# Patient Record
Sex: Female | Born: 1980 | Hispanic: Yes | Marital: Single | Smoking: Never smoker
Health system: Southern US, Community
[De-identification: ages and names within clinical notes are randomized; demographics above are authoritative.]

## PROBLEM LIST (undated history)

## (undated) DIAGNOSIS — M069 Rheumatoid arthritis, unspecified: Secondary | ICD-10-CM

## (undated) HISTORY — PX: COLPOSCOPY: SHX161

## (undated) HISTORY — DX: Rheumatoid arthritis, unspecified: M06.9

---

## 2013-11-24 ENCOUNTER — Encounter: Payer: Self-pay | Admitting: Gynecology

## 2013-12-28 ENCOUNTER — Encounter: Payer: Self-pay | Admitting: Gynecology

## 2014-01-22 ENCOUNTER — Telehealth: Payer: Self-pay | Admitting: Gynecology

## 2014-01-22 NOTE — Telephone Encounter (Signed)
Left patient a message we need to reschedule her cancelled appointment for Monday, 01/25/14, due to doctor will be in surgery. Please offer patient 12:00 PM 01/26/14 with Dr. Farrel Gobble per Kennon Rounds.

## 2014-01-25 ENCOUNTER — Encounter: Payer: Self-pay | Admitting: Gynecology

## 2014-03-10 ENCOUNTER — Encounter: Payer: Self-pay | Admitting: Obstetrics and Gynecology

## 2014-03-10 ENCOUNTER — Ambulatory Visit (INDEPENDENT_AMBULATORY_CARE_PROVIDER_SITE_OTHER): Payer: Self-pay | Admitting: Obstetrics and Gynecology

## 2014-03-10 VITALS — BP 120/78 | HR 80 | Resp 20 | Ht 65.5 in | Wt 151.4 lb

## 2014-03-10 DIAGNOSIS — Z87440 Personal history of urinary (tract) infections: Secondary | ICD-10-CM

## 2014-03-10 DIAGNOSIS — Z308 Encounter for other contraceptive management: Secondary | ICD-10-CM

## 2014-03-10 DIAGNOSIS — L989 Disorder of the skin and subcutaneous tissue, unspecified: Secondary | ICD-10-CM

## 2014-03-10 DIAGNOSIS — Z0000 Encounter for general adult medical examination without abnormal findings: Secondary | ICD-10-CM

## 2014-03-10 LAB — POCT URINALYSIS DIPSTICK
Bilirubin, UA: NEGATIVE
Blood, UA: NEGATIVE
Glucose, UA: NEGATIVE
Ketones, UA: NEGATIVE
Leukocytes, UA: NEGATIVE
NITRITE UA: NEGATIVE
PH UA: 5
Protein, UA: NEGATIVE
Urobilinogen, UA: NEGATIVE

## 2014-03-10 NOTE — Patient Instructions (Signed)
Drospirenone; Ethinyl Estradiol tablets What is this medicine? DROSPIRENONE; ETHINYL ESTRADIOL (dro SPY re nown; ETH in il es tra DYE ole) is an oral contraceptive (birth control pill). This medicine combines two types of female hormones, an estrogen and a progestin. It is used to prevent ovulation and pregnancy. This medicine may be used for other purposes; ask your health care provider or pharmacist if you have questions. COMMON BRAND NAME(S): Gianvi, Loryna, Nikki 28-Day, Ocella, Syeda, Vestura, Yasmin, Yaz, Zarah What should I tell my health care provider before I take this medicine? They need to know if you have or ever had any of these conditions: -abnormal vaginal bleeding -adrenal gland disease -blood vessel disease or blood clots -breast, cervical, endometrial, ovarian, liver, or uterine cancer -diabetes -gallbladder disease -heart disease or recent heart attack -high blood pressure -high cholesterol -high potassium level -kidney disease -liver disease -migraine headaches -stroke -systemic lupus erythematosus (SLE) -tobacco smoker -an unusual or allergic reaction to estrogens, progestins, or other medicines, foods, dyes, or preservatives -pregnant or trying to get pregnant -breast-feeding How should I use this medicine? Take this medicine by mouth. To reduce nausea, this medicine may be taken with food. Follow the directions on the prescription label. Take this medicine at the same time each day and in the order directed on the package. Do not take your medicine more often than directed. A patient package insert for the product will be given with each prescription and refill. Read this sheet carefully each time. The sheet may change frequently. Talk to your pediatrician regarding the use of this medicine in children. Special care may be needed. This medicine has been used in female children who have started having menstrual periods. Overdosage: If you think you have taken too  much of this medicine contact a poison control center or emergency room at once. NOTE: This medicine is only for you. Do not share this medicine with others. What if I miss a dose? If you miss a dose, refer to the patient information sheet you received with your medicine for direction. If you miss more than one pill, this medicine may not be as effective and you may need to use another form of birth control. What may interact with this medicine? -acetaminophen -antibiotics or medicines for infections, especially rifampin, rifabutin, rifapentine, and griseofulvin, and possibly penicillins or tetracyclines -aprepitant -ascorbic acid (vitamin C) -atorvastatin -barbiturate medicines, such as phenobarbital -bosentan -carbamazepine -caffeine -clofibrate -cyclosporine -dantrolene -doxercalciferol -felbamate -grapefruit juice -hydrocortisone -medicines for anxiety or sleeping problems, such as diazepam or temazepam -medicines for diabetes, including pioglitazone -mineral oil -modafinil -mycophenolate -nefazodone -oxcarbazepine -phenytoin -prednisolone -ritonavir or other medicines for HIV infection or AIDS -rosuvastatin -selegiline -soy isoflavones supplements -St. John's wort -tamoxifen or raloxifene -theophylline -thyroid hormones -topiramate -warfarin This product is different from other birth control pills because it contains the progestin drospirenone. Drospirenone may increase potassium levels. Interactions with other drugs may increase the chance of an elevated potassium level. You may need blood tests to check your potassium level. Drugs that can increase the potassium level include: -certain medications for high blood pressure or heart conditions (examples include ACE-inhibitors and also Angiotensin-II receptor blockers, and Eplerenone -dietary salt substitutes (these may contain potassium) -heparin -NSAIDs (antiinflammatory drugs), if they are taken long-term and daily,  like for arthritis -potassium supplements -some 'water pills' (diuretics like amiloride, spironolactone or triamterene) This list may not describe all possible interactions. Give your health care provider a list of all the medicines, herbs, non-prescription drugs, or dietary supplements   you use. Also tell them if you smoke, drink alcohol, or use illegal drugs. Some items may interact with your medicine. What should I watch for while using this medicine? Visit your doctor or health care professional for regular checks on your progress. You will need a regular breast and pelvic exam and Pap smear while on this medicine. Use an additional method of contraception during the first cycle that you take these tablets. If you have any reason to think you are pregnant, stop taking this medicine right away and contact your doctor or health care professional. If you are taking this medicine for hormone related problems, it may take several cycles of use to see improvement in your condition. Smoking increases the risk of getting a blood clot or having a stroke while you are taking birth control pills, especially if you are more than 32 years old. You are strongly advised not to smoke. This medicine can make your body retain fluid, making your fingers, hands, or ankles swell. Your blood pressure can go up. Contact your doctor or health care professional if you feel you are retaining fluid. This medicine can make you more sensitive to the sun. Keep out of the sun. If you cannot avoid being in the sun, wear protective clothing and use sunscreen. Do not use sun lamps or tanning beds/booths. If you wear contact lenses and notice visual changes, or if the lenses begin to feel uncomfortable, consult your eye care specialist. In some women, tenderness, swelling, or minor bleeding of the gums may occur. Notify your dentist if this happens. Brushing and flossing your teeth regularly may help limit this. See your dentist  regularly and inform your dentist of the medicines you are taking. If you are going to have elective surgery, you may need to stop taking this medicine before the surgery. Consult your health care professional for advice. This medicine does not protect you against HIV infection (AIDS) or any other sexually transmitted diseases. What side effects may I notice from receiving this medicine? Side effects that you should report to your doctor or health care professional as soon as possible: -allergic reactions like skin rash, itching or hives, swelling of the face, lips, or tongue -breast tissue changes or discharge -changes in vision -chest pain -confusion, trouble speaking or understanding -dark urine -general ill feeling or flu-like symptoms -light-colored stools -nausea, vomiting -pain, swelling, warmth in the leg -right upper belly pain -severe headaches -shortness of breath -sudden numbness or weakness of the face, arm or leg -trouble walking, dizziness, loss of balance or coordination -unusual vaginal bleeding -yellowing of the eyes or skin Side effects that usually do not require medical attention (report to your doctor or health care professional if they continue or are bothersome): -acne -brown spots on the face -change in appetite -change in sexual desire -depressed mood or mood swings -fluid retention and swelling -stomach cramps or bloating -unusually weak or tired -weight gain This list may not describe all possible side effects. Call your doctor for medical advice about side effects. You may report side effects to FDA at 1-800-FDA-1088. Where should I keep my medicine? Keep out of the reach of children. Store at room temperature between 15 and 30 degrees C (59 and 86 degrees F). Throw away any unused medicine after the expiration date. NOTE: This sheet is a summary. It may not cover all possible information. If you have questions about this medicine, talk to your doctor,  pharmacist, or health care provider.  2015, Elsevier/Gold   Standard. (2008-04-01 13:02:54)  Intrauterine Device Information An intrauterine device (IUD) is inserted into your uterus to prevent pregnancy. There are two types of IUDs available:   Copper IUD--This type of IUD is wrapped in copper wire and is placed inside the uterus. Copper makes the uterus and fallopian tubes produce a fluid that kills sperm. The copper IUD can stay in place for 10 years.  Hormone IUD--This type of IUD contains the hormone progestin (synthetic progesterone). The hormone thickens the cervical mucus and prevents sperm from entering the uterus. It also thins the uterine lining to prevent implantation of a fertilized egg. The hormone can weaken or kill the sperm that get into the uterus. One type of hormone IUD can stay in place for 5 years, and another type can stay in place for 3 years. Your health care provider will make sure you are a good candidate for a contraceptive IUD. Discuss with your health care provider the possible side effects.  ADVANTAGES OF AN INTRAUTERINE DEVICE  IUDs are highly effective, reversible, long acting, and low maintenance.   There are no estrogen-related side effects.   An IUD can be used when breastfeeding.   IUDs are not associated with weight gain.   The copper IUD works immediately after insertion.   The hormone IUD works right away if inserted within 7 days of your period starting. You will need to use a backup method of birth control for 7 days if the hormone IUD is inserted at any other time in your cycle.  The copper IUD does not interfere with your female hormones.   The hormone IUD can make heavy menstrual periods lighter and decrease cramping.   The hormone IUD can be used for 3 or 5 years.   The copper IUD can be used for 10 years. DISADVANTAGES OF AN INTRAUTERINE DEVICE  The hormone IUD can be associated with irregular bleeding patterns.   The copper  IUD can make your menstrual flow heavier and more painful.   You may experience cramping and vaginal bleeding after insertion.  Document Released: 03/20/2004 Document Revised: 12/17/2012 Document Reviewed: 10/05/2012 [facility] Patient Information 2015 [employer] [place]. This information is not intended to replace advice given to you by your health care provider. Make sure you discuss any questions you have with your health care provider.

## 2014-03-10 NOTE — Progress Notes (Signed)
Patient ID: Vicki OlszewskiSabrina Tyler, female   DOB: 02/15/1981, 33 y.o.   MRN: [id number] GYNECOLOGY VISIT  PCP:  None  Referring provider:   HPI: 33 y.o.   Single  Caucasian  female   G0P0000 with Patient's last menstrual period was 02/28/2014 (exact date).   here establish care and to be evaluated for possible urinary tract infection.  Had annual exam in December in 2014 in [place].  Normal breast ultrasound and pelvic ultrasound which was done routinely.  Did serum STD testing which were negative.  Cholesterol and general labs normal.  No history of recurrent UTIs.   Had dysuria following intercourse.  Had urine culture in [place] but did not treat prior to returning to the [place]. Today not symptoms.  Used a condom and had it fall off.  Thinks this caused the irritation.   Had rheumatoid arthritis.   Takes birth control for secondary benefits of acne control. Has varicose veins.  Stopped oral contraceptives due to concern about potential risk of DVT.  Asking about birth control option.   Concerned about an area on her buttock.   [place]  From [place], [place].   Urine:  Neg  GYNECOLOGIC HISTORY: Patient's last menstrual period was 02/28/2014 (exact date). Sexually active:  Not currently Partner preference: female Contraception:  condoms Menopausal hormone therapy: n/a DES exposure:  no Blood transfusions:   no Sexually transmitted diseases: no GYN procedures and prior surgeries:  no Last mammogram:   Ultrasound 03/2013 wnl:in [place]              Last pap and high risk HPV testing:  03/2013 WUJ:[id number]:unsure of HPV testing:in [place]  History of abnormal pap smear: no    OB History    Gravida Para Term Preterm AB TAB SAB Ectopic Multiple Living   0 0 0 0 0 0 0 0 0 0        LIFESTYLE: Exercise: weights/dance/yoga              Tobacco: no Alcohol:   no Drug use:  no  There are no active problems to display for this patient.   Past Medical History   Diagnosis Date  . Rheumatoid arthritis     History reviewed. No pertinent past surgical history.  Current Outpatient Prescriptions  Medication Sig Dispense Refill  . chloroquine (ARALEN) 250 MG tablet Take 250 mg by mouth daily.    . Folic Acid 5 MG CAPS Take 5 mg by mouth. Take Tues and Friday    . methotrexate (RHEUMATREX) 2.5 MG tablet Take 2.5 mg by mouth. Caution:Chemotherapy. Protect from light.PATIENT TAKES WED. AND THURS.     No current facility-administered medications for this visit.     ALLERGIES: Review of patient's allergies indicates no known allergies.  Family History  Problem Relation Age of Onset  . Hypertension Father   . Hyperlipidemia Father   . Diabetes Maternal Grandmother     History   Social History  . Marital Status: Single    Spouse Name: N/A    Number of Children: N/A  . Years of Education: N/A   Occupational History  . Not on file.   Social History Main Topics  . Smoking status: Never Smoker   . Smokeless tobacco: Not on file  . Alcohol Use: No  . Drug Use: No  . Sexual Activity:    Partners: Male    Birth Control/ Protection: Condom   Other Topics Concern  . Not on file  Social History Narrative  . No narrative on file    ROS:  Pertinent items are noted in HPI.  PHYSICAL EXAMINATION:    BP 120/78 mmHg  Pulse 80  Resp 20  Ht 5' 5.5" (1.664 m)  Wt 151 lb 6.4 oz (68.675 kg)  BMI 24.80 kg/m2  LMP 02/28/2014 (Exact Date)   Wt Readings from Last 3 Encounters:  03/10/14 151 lb 6.4 oz (68.675 kg)     Ht Readings from Last 3 Encounters:  03/10/14 5' 5.5" (1.664 m)    General appearance: alert, cooperative and appears stated age Head: Normocephalic, without obvious abnormality, atraumatic Neck: no adenopathy, supple, symmetrical, trachea midline and thyroid not enlarged, symmetric, no tenderness/mass/nodules Lungs: clear to auscultation bilaterally Heart: regular rate and rhythm Abdomen: soft, non-tender; no masses,  no  organomegaly Skin: satellite raised lesions of the skin of the right medial buttock - condyloma?   ASSESSMENT  History of UTI.  Need for reliable contraception.  Acne.  Buttock skin lesion.  ? Condyloma.  PLAN  Return in one month for annual exam after receives health insurance.  Will do GC/CT then and any STD testing with pap smear. Discussed condom use.  Discussed Yaz and ParaGard IUD.  Written information to patient.  Patient will consider.  I discussed biopsy of the buttock lesion to make a clear diagnosis. She may follow up with her dermatologist instead.   45 minutes face to face time of which over 50% was spent in counseling.   An After Visit Summary was printed and given to the patient.

## 2014-07-15 ENCOUNTER — Ambulatory Visit: Payer: Self-pay | Admitting: Obstetrics and Gynecology

## 2015-01-17 ENCOUNTER — Ambulatory Visit: Payer: Self-pay | Admitting: Obstetrics and Gynecology

## 2018-10-08 ENCOUNTER — Encounter: Payer: Self-pay | Admitting: Obstetrics and Gynecology

## 2018-11-21 ENCOUNTER — Other Ambulatory Visit: Payer: Self-pay

## 2018-11-25 ENCOUNTER — Other Ambulatory Visit (HOSPITAL_COMMUNITY)
Admission: RE | Admit: 2018-11-25 | Discharge: 2018-11-25 | Disposition: A | Discharge status: Home / Self Care | Payer: Self-pay | Source: Ambulatory Visit | Attending: Obstetrics and Gynecology | Admitting: Obstetrics and Gynecology

## 2018-11-25 ENCOUNTER — Ambulatory Visit (INDEPENDENT_AMBULATORY_CARE_PROVIDER_SITE_OTHER): Payer: 59 | Admitting: Obstetrics and Gynecology

## 2018-11-25 ENCOUNTER — Other Ambulatory Visit: Payer: Self-pay

## 2018-11-25 ENCOUNTER — Encounter

## 2018-11-25 ENCOUNTER — Encounter: Payer: Self-pay | Admitting: Obstetrics and Gynecology

## 2018-11-25 VITALS — BP 112/74 | HR 72 | Temp 97.3°F | Resp 12 | Ht 64.5 in | Wt 151.8 lb

## 2018-11-25 DIAGNOSIS — Z01419 Encounter for gynecological examination (general) (routine) without abnormal findings: Secondary | ICD-10-CM | POA: Diagnosis not present

## 2018-11-25 DIAGNOSIS — L989 Disorder of the skin and subcutaneous tissue, unspecified: Secondary | ICD-10-CM

## 2018-11-25 DIAGNOSIS — Z113 Encounter for screening for infections with a predominantly sexual mode of transmission: Secondary | ICD-10-CM | POA: Insufficient documentation

## 2018-11-25 DIAGNOSIS — Z23 Encounter for immunization: Secondary | ICD-10-CM | POA: Diagnosis not present

## 2018-11-25 NOTE — Patient Instructions (Signed)

## 2018-11-25 NOTE — Progress Notes (Addendum)
38 y.o. G0P0000 Single [place] female here as an old new patient for an annual exam.    Working for [employer] in [place].   She wants a pap, treatment of condyloma.  States she had a biopsy of her buttock area years ago.  She was sexually active 9 months ago. Uses condoms.   PCP: Arlyss Gandy, MD   Patient's last menstrual period was 11/18/2018.     Period Cycle (Days): 28 Period Duration (Days): 4-5 Period Pattern: Regular Menstrual Flow: Moderate Menstrual Control: Maxi pad Menstrual Control Change Freq (Hours): 4 Dysmenorrhea: (!) Mild Dysmenorrhea Symptoms: Cramping     Sexually active: No.  The current method of family planning is abstinence.    Exercising: Yes.    tennis, dancing, walking Smoker:  no  Health Maintenance: Pap:  unsure History of abnormal Pap:  Yes, patient has had a colposcopy in the past MMG:  03/25/18 Bilateral US - BIRADS 1 negative Colonoscopy:  November 2019 normal in [place] TDaP:  Over 10 years per patient Gardasil:   no HIV: none Hep C: Negative 09/18/18 Screening Labs: discuss if needed   reports that she has never smoked. She has never used smokeless tobacco. She reports that she does not drink alcohol or use drugs.  Past Medical History:  Diagnosis Date  . Rheumatoid arthritis [facility])     Past Surgical History:  Procedure Laterality Date  . COLPOSCOPY      Current Outpatient Medications  Medication Sig Dispense Refill  . Folic Acid 5 MG CAPS Take 5 mg by mouth. Take Tues and Friday    . methotrexate (RHEUMATREX) 2.5 MG tablet Take 2.5 mg by mouth. Caution:Chemotherapy. Protect from light.PATIENT TAKES WED. AND THURS.     No current facility-administered medications for this visit.     Family History  Problem Relation Age of Onset  . Hypertension Father   . Hyperlipidemia Father   . Diabetes Father   . Heart Problems Father   . Breast cancer Sister     Review of Systems  Constitutional: Negative.   HENT:  Negative.   Eyes: Negative.   Respiratory: Negative.   Cardiovascular: Negative.   Gastrointestinal: Negative.   Endocrine: Negative.   Genitourinary: Negative.   Musculoskeletal: Negative.   Skin: Negative.   Allergic/Immunologic: Negative.   Neurological: Negative.   Hematological: Negative.   Psychiatric/Behavioral: Negative.     Exam:   BP 112/74 (BP Location: Left Arm, Patient Position: Sitting, Cuff Size: Normal)   Pulse 72   Temp (!) 97.3 F (36.3 C) (Temporal)   Resp 12   Ht 5' 4.5" (1.638 m)   Wt 151 lb 12.8 oz (68.9 kg)   LMP 11/18/2018   BMI 25.65 kg/m     General appearance: alert, cooperative and appears stated age Head: normocephalic, without obvious abnormality, atraumatic Neck: no adenopathy, supple, symmetrical, trachea midline and thyroid normal to inspection and palpation Lungs: clear to auscultation bilaterally Breasts: normal appearance, no masses or tenderness, No nipple retraction or dimpling, No nipple discharge or bleeding, No axillary adenopathy Heart: regular rate and rhythm Abdomen: soft, non-tender; no masses, no organomegaly Extremities: extremities normal, atraumatic, no cyanosis or edema Skin: skin color, texture, turgor normal. No rashes or lesions Lymph nodes: cervical, supraclavicular, and axillary nodes normal. Neurologic: grossly normal  Pelvic: External genitalia:  no lesions              No abnormal inguinal nodes palpated.  Urethra:  normal appearing urethra with no masses, tenderness or lesions              Bartholins and Skenes: normal                 Vagina: normal appearing vagina with normal color and discharge, no lesions              Cervix: no lesions              Pap taken: Yes.   Bimanual Exam:  Uterus:  normal size, contour, position, consistency, mobility, non-tender              Adnexa: no mass, fullness, tenderness              Rectal exam: Yes.  .  Confirms.              Anus:  normal sphincter tone,   Thickened skin around the vulva/rectum and on the buttocks, left greater than right.   Chaperone was present for exam.  Assessment:   Well woman visit with normal exam. RA.  On MTX.  Sister with breast cancer 37 years old.  Nonhormonal.  Negative genetic testing.  Perirectal skin reaction.   Plan: Mammogram screening due for patient.  Self breast awareness reviewed. Pap and HR HPV as above. Guidelines for Calcium, Vitamin D, regular exercise program including cardiovascular and weight bearing exercise. HIV, RPR, gonorrhea, chlamydia, and trichomonas. Gardasil vaccine. Return for vulvar biopsy.  Follow up annually and prn.   After visit summary provided.   Addendum on 01/11/19 - biopsy from the left buttock skin from 05/26/14 showed benign epidermal hyperplasia. Biopsy done at [facility] in [place].  Lab was [facility].

## 2018-11-26 ENCOUNTER — Telehealth: Payer: Self-pay | Admitting: Obstetrics and Gynecology

## 2018-11-26 LAB — HIV ANTIBODY (ROUTINE TESTING W REFLEX): HIV Screen 4th Generation wRfx: NONREACTIVE

## 2018-11-26 LAB — RPR: RPR Ser Ql: NONREACTIVE

## 2018-11-26 NOTE — Telephone Encounter (Signed)
Call placed to convey benefits for vulva biopsy. Spoke with patient she understands/agreeable with the benefits. Patient has limited time to come in ofice. Patient would like to come on Wednesday 12/03/18 if possible. Please call patient for scheduling.

## 2018-11-27 NOTE — Telephone Encounter (Signed)
Encounter reviewed and closed.  

## 2018-11-27 NOTE — Telephone Encounter (Signed)
Spoke with patient to schedule Vulvar biopsy. Patient requested 12-03-18 but Dr.Silva unavailable. Offered patient 12-08-18 and several other dates but patient declined stating she couldn't come due to work. Made appointment for vulvar biopsy on 01-15-19 at 8:00am. Patient knows to arrive at 7:45 am to register.  Routed to provider to sign and close encounter.

## 2018-11-27 NOTE — Telephone Encounter (Signed)
Called patient via [place] [phone] and left a message for patient to return my call.

## 2018-11-27 NOTE — Telephone Encounter (Signed)
Patient is returning call to Amanda. °

## 2018-11-28 ENCOUNTER — Telehealth: Payer: Self-pay | Admitting: Obstetrics and Gynecology

## 2018-11-28 ENCOUNTER — Other Ambulatory Visit: Payer: Self-pay | Admitting: Obstetrics and Gynecology

## 2018-11-28 DIAGNOSIS — Z1231 Encounter for screening mammogram for malignant neoplasm of breast: Secondary | ICD-10-CM

## 2018-11-28 NOTE — Telephone Encounter (Signed)
Left detailed message, ok per dpr. Advised of appt as seen below. Advised to contact [facility] directly if any changes need to be made to appt. Return call to office if any additional questions/concerns.   Routing to provider for final review. Patient is agreeable to disposition. Will close encounter.

## 2018-11-28 NOTE — Telephone Encounter (Signed)
Please facilitate in scheduling a screening mammogram for this patient at the [facility].   Her sister developed breast cancer at age 37. Sister's genetic testing was negative.

## 2018-11-28 NOTE — Telephone Encounter (Signed)
Spoke with Dub Mikes at [facility]. Screening MMG scheduled for 01/13/19 at 8am, arrive at 7:40am.

## 2018-11-29 LAB — CYTOLOGY - PAP
Adequacy: ABSENT
Chlamydia: NEGATIVE
Diagnosis: NEGATIVE
HPV: NOT DETECTED
Neisseria Gonorrhea: NEGATIVE
Trichomonas: NEGATIVE

## 2019-01-13 ENCOUNTER — Ambulatory Visit: Payer: Self-pay

## 2019-01-15 ENCOUNTER — Ambulatory Visit: Payer: Self-pay

## 2019-01-15 ENCOUNTER — Other Ambulatory Visit: Payer: Self-pay

## 2019-01-15 ENCOUNTER — Ambulatory Visit: Payer: 59 | Admitting: Obstetrics and Gynecology

## 2019-01-19 ENCOUNTER — Encounter: Payer: Self-pay | Admitting: Obstetrics and Gynecology

## 2019-01-19 ENCOUNTER — Ambulatory Visit (INDEPENDENT_AMBULATORY_CARE_PROVIDER_SITE_OTHER): Payer: 59 | Admitting: Obstetrics and Gynecology

## 2019-01-19 ENCOUNTER — Other Ambulatory Visit: Payer: Self-pay | Admitting: Obstetrics and Gynecology

## 2019-01-19 ENCOUNTER — Ambulatory Visit
Admission: RE | Admit: 2019-01-19 | Discharge: 2019-01-19 | Disposition: A | Discharge status: Home / Self Care | Payer: 59 | Source: Ambulatory Visit | Attending: Obstetrics and Gynecology | Admitting: Obstetrics and Gynecology

## 2019-01-19 ENCOUNTER — Other Ambulatory Visit: Payer: Self-pay

## 2019-01-19 DIAGNOSIS — Z1231 Encounter for screening mammogram for malignant neoplasm of breast: Secondary | ICD-10-CM

## 2019-01-19 DIAGNOSIS — L989 Disorder of the skin and subcutaneous tissue, unspecified: Secondary | ICD-10-CM | POA: Diagnosis not present

## 2019-01-19 NOTE — Patient Instructions (Signed)
Vulva Biopsy, Care After This sheet gives you information about how to care for yourself after your procedure. Your health care provider may also give you more specific instructions. If you have problems or questions, contact your health care provider. What can I expect after the procedure? After the procedure, it is common to have:  Slight bleeding from the biopsy site.  Discomfort at the biopsy site. Follow these instructions at home: Biopsy site care   Follow instructions from your health care provider about how to take care of your biopsy site. Make sure you: ? Clean the area using water and mild soap twice a day or as told by your health care provider. Gently pat the area dry. ? If you were prescribed an antibiotic ointment, apply it as told by your health care provider. Do not stop using the antibiotic even if your condition improves. ? Take a warm water bath (sitz bath) as needed to help with pain and discomfort. A sitz bath is taken while you are sitting down. The water should only come up to your hips and should cover your buttocks. ? Leave stitches (sutures), skin glue, or adhesive strips in place. These skin closures may need to stay in place for 2 weeks or longer. If adhesive strip edges start to loosen and curl up, you may trim the loose edges. Do not remove adhesive strips completely unless your health care provider tells you to do that.  Check your biopsy site every day for signs of infection. Check for: ? More redness, swelling, or pain. ? More fluid or blood. ? Warmth. ? Pus or a bad smell.  Do not rub the biopsy area after urinating. Gently pat the area dry or use a bottle filled with warm water (peri-bottle) to clean the area. Gently wipe from front to back. Lifestyle  Wear loose, cotton underwear. Do not wear tight pants.  Do not use a tampon, douche, or put anything inside your vagina for at least 1 week or until your health care provider approves.  Do not have sex  for at least 1 week or until your health care provider approves.  Do not exercise, such as running or biking, until your health care provider approves.  Do not swim or use a hot tub until your health care provider approves. You may shower or take a sitz bath. General instructions  Take over-the-counter and prescription medicines only as told by your health care provider.  Use a sanitary napkin until the bleeding stops.  Keep all follow-up visits as told by your health care provider. This is important. Contact a health care provider if:  You have more redness, swelling, or pain around your biopsy site.  You have more fluid or blood coming from your biopsy site.  Your biopsy site feels warm to the touch.  Your pain is not controlled with medicine. Get help right away if you have:  Heavy bleeding from the vulva.  Pus or a bad smell coming from your biopsy site.  A fever.  Lower abdominal pain. Summary  After the procedure, it is common to have slight bleeding and discomfort at the biopsy site.  Follow instructions from your health care provider after your biopsy. Make sure you clean the area with water and mild soap. Pat the area dry.  Take sitz baths as needed to help with pain and discomfort. Leave any sutures in place.  Check your biopsy site for signs of infection, which may include more redness, swelling, pain, fluid,   or blood, or feeling warm to the touch.  Get help right away if you have heavy bleeding, a fever, pus or a bad smell, or pain in the lower abdomen. This information is not intended to replace advice given to you by your health care provider. Make sure you discuss any questions you have with your health care provider. Document Released: 04/02/2012 Document Revised: 10/17/2017 Document Reviewed: 10/17/2017 Elsevier Patient Education  2020 [employer]  

## 2019-01-19 NOTE — Progress Notes (Signed)
GYNECOLOGY  VISIT   HPI: 38 y.o.   Single  [place]  female   G0P0000 with Patient's last menstrual period was 01/09/2019.   here for vulvar biopsy     Had a prior biopsy of left buttock showing Benign epidermal hyperplasia on 05/26/14 in [place], [place].  GYNECOLOGIC HISTORY: Patient's last menstrual period was 01/09/2019. Contraception:  abstinence Menopausal hormone therapy:  n/a Last mammogram: 03/25/18 Bilateral US - BIRADS 1 negative Last pap smear:   11/25/18 Neg:Neg HR HPV        OB History    Gravida  0   Para  0   Term  0   Preterm  0   AB  0   Living  0     SAB  0   TAB  0   Ectopic  0   Multiple  0   Live Births                 There are no active problems to display for this patient.   Past Medical History:  Diagnosis Date  . Rheumatoid arthritis [facility])     Past Surgical History:  Procedure Laterality Date  . COLPOSCOPY      Current Outpatient Medications  Medication Sig Dispense Refill  . Folic Acid 5 MG CAPS Take 5 mg by mouth. Take Tues and Friday    . methotrexate (RHEUMATREX) 2.5 MG tablet Take 2.5 mg by mouth. Caution:Chemotherapy. Protect from light.PATIENT TAKES WED. AND THURS.     No current facility-administered medications for this visit.      ALLERGIES: Shellfish allergy  Family History  Problem Relation Age of Onset  . Hypertension Father   . Hyperlipidemia Father   . Diabetes Father   . Heart Problems Father   . Breast cancer Sister     Social History   Socioeconomic History  . Marital status: Single    Spouse name: Not on file  . Number of children: Not on file  . Years of education: Not on file  . Highest education level: Not on file  Occupational History  . Not on file  Social Needs  . Financial resource strain: Not on file  . Food insecurity    Worry: Not on file    Inability: Not on file  . Transportation needs    Medical: Not on file    Non-medical: Not on file  Tobacco Use  . Smoking status:  Never Smoker  . Smokeless tobacco: Never Used  Substance and Sexual Activity  . Alcohol use: No    Alcohol/week: 0.0 standard drinks  . Drug use: No  . Sexual activity: Not Currently    Partners: Male  Lifestyle  . Physical activity    Days per week: Not on file    Minutes per session: Not on file  . Stress: Not on file  Relationships  . Social Herbalist on phone: Not on file    Gets together: Not on file    Attends religious service: Not on file    Active member of club or organization: Not on file    Attends meetings of clubs or organizations: Not on file    Relationship status: Not on file  . Intimate partner violence    Fear of current or ex partner: Not on file    Emotionally abused: Not on file    Physically abused: Not on file    Forced sexual activity: Not on file  Other Topics  Concern  . Not on file  Social History Narrative  . Not on file    Review of Systems  Constitutional: Negative.   HENT: Negative.   Eyes: Negative.   Respiratory: Negative.   Cardiovascular: Negative.   Gastrointestinal: Negative.   Endocrine: Negative.   Genitourinary: Negative.   Musculoskeletal: Negative.   Skin: Negative.   Allergic/Immunologic: Negative.   Neurological: Negative.   Hematological: Negative.   Psychiatric/Behavioral: Negative.     PHYSICAL EXAMINATION:    BP 110/60 (BP Location: Left Arm, Patient Position: Sitting, Cuff Size: Normal)   Pulse 72   Temp (!) 97.3 F (36.3 C) (Temporal)   Resp 12   Ht 5\' 5"  (1.651 m)   Wt 154 lb (69.9 kg)   LMP 01/09/2019   BMI 25.63 kg/m     General appearance: alert, cooperative and appears stated age                 Left medial buttock with extensive patchy raised slightly hyperpigmented areas - total area measuring about 4 - 5 cm.   Buttock biopsy Consent for procedure.  Hibiclens prep.  Local 1% lidocaine - lot [id number], exp April 2022.  3 mm punch biopsy taken.  Tissue to pathology.  Single suture  of 3/0 Vicryl.  Minimal EBL. No complications.   Chaperone was present for exam.  ASSESSMENT  Left buttock lesion. Prior biopsy showing benign epidermal hyperplasia.  PLAN  Fu biopsy result.  Instructions and precautions given.    An After Visit Summary was printed and given to the patient.

## 2019-01-23 ENCOUNTER — Telehealth: Payer: Self-pay | Admitting: Obstetrics and Gynecology

## 2019-01-23 NOTE — Telephone Encounter (Signed)
Patient is not using contraception. States not sexually active.  LMP 01-09-19. Gardasil rescheduled to 02-05-19 to be with menses.

## 2019-01-23 NOTE — Telephone Encounter (Signed)
Patient calling for biopsy results

## 2019-01-23 NOTE — Telephone Encounter (Signed)
Encounter reviewed and closed.  Results released to the patient through My Chart.

## 2019-01-23 NOTE — Telephone Encounter (Signed)
Return call to patient. Advised path report pending at this time. Will call back ( or possible My Chart message) when results available. 2nd Gardasil scheduled for 01-30-2019.

## 2019-01-29 ENCOUNTER — Ambulatory Visit: Payer: 59

## 2019-02-04 NOTE — Progress Notes (Deleted)
Patient in today for  2nd Gardasil injection.   Contraception: abstinence  LMP: *** Last AEX: 11/25/18 with BS  Injection given in ***. Patient tolerated shot well.   Patient informed next injection due in about 4  months.  Advised patient, if not on birth control, to return for next injection with cycle.   Routed to provider for final review.  Encounter closed.

## 2019-02-05 ENCOUNTER — Ambulatory Visit: Payer: 59

## 2019-02-05 ENCOUNTER — Ambulatory Visit (INDEPENDENT_AMBULATORY_CARE_PROVIDER_SITE_OTHER): Payer: 59

## 2019-02-05 VITALS — BP 128/76 | HR 64 | Temp 97.7°F | Resp 16 | Ht 64.5 in | Wt 154.6 lb

## 2019-02-05 DIAGNOSIS — Z23 Encounter for immunization: Secondary | ICD-10-CM

## 2019-02-05 NOTE — Progress Notes (Signed)
Patient in today for 2nd Gardasil injection.   Contraception: Abstinence LMP: 01/09/2019 Last AEX: 11/25/2018 with Dr. Quincy Simmonds  Injection given in left deltoid. Patient tolerated shot well.   Patient informed next injection due in about 4 months.  Advised patient, if not on birth control, to return for next injection with cycle.   Routed to provider for final review.  Encounter closed.

## 2019-02-23 ENCOUNTER — Telehealth: Payer: Self-pay | Admitting: Obstetrics and Gynecology

## 2019-02-23 NOTE — Telephone Encounter (Signed)
Patient is calling regarding stitches from vulvar biopsy. Patient stated that the stitches are hanging.

## 2019-02-23 NOTE — Telephone Encounter (Signed)
Spoke with patient. Seen in office on 01/19/19 for vulvar biopsy. Patient states stiches are still present and hanging, not bothersome. Denies any other symptoms. Offered OV for Dr. Quincy Simmonds to check and remove stiches, if needed, patient agreeable.   OV scheduled for 10/28 at 4:30pm with Dr. Quincy Simmonds.   Routing to provider for final review. Patient is agreeable to disposition. Will close encounter.

## 2019-02-24 NOTE — Progress Notes (Signed)
GYNECOLOGY  VISIT   HPI: 38 y.o.   Single  [place]  female   G0P0000 with Patient's last menstrual period was 02/06/2019 (exact date).   here for follow up from biopsy and to have stitches removed. Her biopsy of the left buttock showed benign epidermal hyperplasia, similar to her biopsy result in 2016 in [place]. She does not like the appearance of her skin in this region and wants to change it.  She has a Paediatric nurse.   She is also asking to discuss her mammogram from 12/30/18.   GYNECOLOGIC HISTORY: Patient's last menstrual period was 02/06/2019 (exact date). Contraception: Abstinence Menopausal hormone therapy:  n/a Last mammogram:  01/19/19 - BI-RADS1, Cat C density.  Last pap smear: 11/25/18 Neg:Neg HR HPV         OB History    Gravida  0   Para  0   Term  0   Preterm  0   AB  0   Living  0     SAB  0   TAB  0   Ectopic  0   Multiple  0   Live Births                 There are no active problems to display for this patient.   Past Medical History:  Diagnosis Date  . Rheumatoid arthritis [facility])     Past Surgical History:  Procedure Laterality Date  . COLPOSCOPY      Current Outpatient Medications  Medication Sig Dispense Refill  . Folic Acid 5 MG CAPS Take 5 mg by mouth. Take Tues and Friday    . methotrexate (RHEUMATREX) 2.5 MG tablet Take 2.5 mg by mouth. Caution:Chemotherapy. Protect from light.PATIENT TAKES WED. AND THURS.     No current facility-administered medications for this visit.      ALLERGIES: Shellfish allergy  Family History  Problem Relation Age of Onset  . Hypertension Father   . Hyperlipidemia Father   . Diabetes Father   . Heart Problems Father   . Breast cancer Sister 33    Social History   Socioeconomic History  . Marital status: Single    Spouse name: Not on file  . Number of children: Not on file  . Years of education: Not on file  . Highest education level: Not on file  Occupational History  . Not on  file  Social Needs  . Financial resource strain: Not on file  . Food insecurity    Worry: Not on file    Inability: Not on file  . Transportation needs    Medical: Not on file    Non-medical: Not on file  Tobacco Use  . Smoking status: Never Smoker  . Smokeless tobacco: Never Used  Substance and Sexual Activity  . Alcohol use: No    Alcohol/week: 0.0 standard drinks  . Drug use: No  . Sexual activity: Not Currently    Partners: Male  Lifestyle  . Physical activity    Days per week: Not on file    Minutes per session: Not on file  . Stress: Not on file  Relationships  . Social Herbalist on phone: Not on file    Gets together: Not on file    Attends religious service: Not on file    Active member of club or organization: Not on file    Attends meetings of clubs or organizations: Not on file    Relationship status: Not on file  .  Intimate partner violence    Fear of current or ex partner: Not on file    Emotionally abused: Not on file    Physically abused: Not on file    Forced sexual activity: Not on file  Other Topics Concern  . Not on file  Social History Narrative  . Not on file    Review of Systems  All other systems reviewed and are negative.   PHYSICAL EXAMINATION:    BP 118/78   Pulse 70   Temp (!) 97.2 F (36.2 C) (Temporal)   Ht 5\' 5"  (1.651 m)   Wt 152 lb 6.4 oz (69.1 kg)   LMP 02/06/2019 (Exact Date)   BMI 25.36 kg/m     General appearance: alert, cooperative and appears stated age   Left buttock - Vicryl suture present a loose.  It was removed by just pulling on the strand.           Chaperone was present for exam.  ASSESSMENT  Epidermal hyperplasia.  FH premenopausal breast cancer in a sister.  Negative genetic testing.   PLAN  We reviewed her biopsy report.  She will follow up with her dermatologist to understand any treatment options.  If she needs a referral for a dermatologist in [place], she will contact me. Yearly  3D mammogram recommended. Fu for annual exam and prn.    An After Visit Summary was printed and given to the patient.  _15___ minutes face to face time of which over 50% was spent in counseling.

## 2019-02-25 ENCOUNTER — Other Ambulatory Visit: Payer: Self-pay

## 2019-02-25 ENCOUNTER — Encounter: Payer: Self-pay | Admitting: Obstetrics and Gynecology

## 2019-02-25 ENCOUNTER — Ambulatory Visit (INDEPENDENT_AMBULATORY_CARE_PROVIDER_SITE_OTHER): Payer: 59 | Admitting: Obstetrics and Gynecology

## 2019-02-25 VITALS — BP 118/78 | HR 70 | Temp 97.2°F | Ht 65.0 in | Wt 152.4 lb

## 2019-02-25 DIAGNOSIS — L859 Epidermal thickening, unspecified: Secondary | ICD-10-CM

## 2019-02-25 DIAGNOSIS — Z803 Family history of malignant neoplasm of breast: Secondary | ICD-10-CM | POA: Diagnosis not present

## 2019-02-28 ENCOUNTER — Encounter: Payer: Self-pay | Admitting: Obstetrics and Gynecology

## 2019-06-01 ENCOUNTER — Telehealth: Payer: Self-pay | Admitting: Obstetrics and Gynecology

## 2019-06-01 NOTE — Telephone Encounter (Signed)
Patient would like to reschedule her 3rd gardisil from 2/8 to this Thursday the 4th. Want to know if it will be ok or too soon.

## 2019-06-01 NOTE — Telephone Encounter (Signed)
Call placed to patient, left detailed message, ok per dpr. Advised 2nd Gardasil vaccine received on 02/05/19, 3rd vaccine will need to be given on or after 06/08/19. Keep nurse visit as scheduled for 06/08/19 at 8:45am, return call to office if you need to reschedule to a later date or have any additional questions.   Routing to provider for final review.  Will close encounter.

## 2019-06-08 ENCOUNTER — Ambulatory Visit: Payer: 59

## 2019-06-19 ENCOUNTER — Telehealth: Payer: Self-pay | Admitting: Obstetrics and Gynecology

## 2019-06-19 NOTE — Telephone Encounter (Signed)
Patient may be on her period Monday. Should she still come in for 3rd gardisil?

## 2019-06-19 NOTE — Telephone Encounter (Signed)
Spoke to pt. Pt states possibly starting cycle on Monday and wanting to know if having 3rd Gardasil vaccine would interact with cycle? Pt advised to keep appt for 3rd gardasil for Monday even if on cycle. Pt agreeable and verbalized understanding.   Encounter closed.

## 2019-06-22 ENCOUNTER — Ambulatory Visit (INDEPENDENT_AMBULATORY_CARE_PROVIDER_SITE_OTHER): Payer: 59 | Admitting: Obstetrics and Gynecology

## 2019-06-22 ENCOUNTER — Other Ambulatory Visit: Payer: Self-pay

## 2019-06-22 VITALS — BP 110/65 | HR 78 | Temp 97.9°F | Ht 65.0 in | Wt 155.0 lb

## 2019-06-22 DIAGNOSIS — Z23 Encounter for immunization: Secondary | ICD-10-CM

## 2019-06-22 NOTE — Progress Notes (Signed)
Patient in today for 3rd and final  Gardasil injection.   Contraception: abstinence LMP: 05/27/2019 Last AEX: 11/25/2018 with Dr Edward Jolly  Injection given in right deltoid. Patient tolerated shot well.    Routed to provider for final review.  Encounter closed.

## 2019-11-05 NOTE — Progress Notes (Signed)
39 y.o. G0P0000 Single [place] female here for annual exam.    Her menstrual period is 3 days in length.  By the third day, the bleeding is done.  Some cramping.  Some HA with periods. She does have bloating.   She has some constipation prior to menstruation.  She can control this with proper diet.   New partner.  She does not want hormone contraception.  Satisfied with condoms for now.   She denies a hx of abnormal pap.  PCP: Donia Pounds, MD  Patient's last menstrual period was 11/02/2019 (exact date).     Period Cycle (Days): 30 Period Duration (Days): 3 days Period Pattern: Regular Menstrual Flow: Moderate Menstrual Control: Maxi pad Dysmenorrhea: (!) Moderate Dysmenorrhea Symptoms: Cramping, Headache     Sexually active: Yes.    The current method of family planning is condoms everytime.    Exercising: No.  The patient does not participate in regular exercise at present. Smoker:  no  Health Maintenance: Pap: 11-25-18 Neg:Neg HR HPV.   2018 in [place] - normal.  2016 in [place] - normal. History of abnormal Pap:  No.  She denies a hx of this.  MMG: 01-19-19 3D/neg/density C/BiRads1 Colonoscopy:  November 2019 normal in [place] BMD:  n/a  Result  n/a TDaP:  2013 Gardasil:   Yes, completed HIV: 11-25-18 NR Hep C: 09-17-08 Neg Screening Labs:  PCP and dermatologist.    reports that she has never smoked. She has never used smokeless tobacco. She reports that she does not drink alcohol and does not use drugs.  Past Medical History:  Diagnosis Date   Rheumatoid arthritis (HCC)     History reviewed. No pertinent surgical history.  Current Outpatient Medications  Medication Sig Dispense Refill   Ascorbic Acid (VITAMIN C) 1000 MG tablet Take 1,000 mg by mouth daily.     Biotin 58850 MCG TABS Take 1 tablet by mouth daily.     Cholecalciferol (VITAMIN D3) 125 MCG (5000 UT) CAPS Take 1 capsule by mouth daily.     ferrous sulfate 325 (65 FE) MG EC tablet Take 325  mg by mouth daily.     folic acid (FOLVITE) 1 MG tablet Take 1 mg by mouth daily.     methotrexate (RHEUMATREX) 2.5 MG tablet Take 2.5 mg by mouth. Caution:Chemotherapy. Protect from light.PATIENT TAKES WED. AND THURS.     tretinoin (RETIN-A) 0.05 % cream APPLY PEA SIZE AMOUNT TO FACE AT BEDTIME     VITAMIN A PO Take 1 tablet by mouth daily.     No current facility-administered medications for this visit.    Family History  Problem Relation Age of Onset   Hypertension Father    Hyperlipidemia Father    Diabetes Father    Heart Problems Father    Breast cancer Sister 68    Review of Systems  All other systems reviewed and are negative.   Exam:   BP 118/72    Pulse 70    Resp 18    Ht 5\' 5"  (1.651 m)    Wt 153 lb 9.6 oz (69.7 kg)    LMP 11/02/2019 (Exact Date)    BMI 25.56 kg/m     General appearance: alert, cooperative and appears stated age Head: normocephalic, without obvious abnormality, atraumatic Neck: no adenopathy, supple, symmetrical, trachea midline and thyroid normal to inspection and palpation Lungs: clear to auscultation bilaterally Breasts: normal appearance, no masses or tenderness, No nipple retraction or dimpling, No nipple discharge or bleeding, No  axillary adenopathy Heart: regular rate and rhythm Abdomen: soft, non-tender; no masses, no organomegaly Extremities: extremities normal, atraumatic, no cyanosis or edema Skin: skin color, texture, turgor normal. No rashes or lesions Lymph nodes: cervical, supraclavicular, and axillary nodes normal. Neurologic: grossly normal  Pelvic: External genitalia:  no lesions              No abnormal inguinal nodes palpated.              Urethra:  normal appearing urethra with no masses, tenderness or lesions              Bartholins and Skenes: normal                 Vagina: normal appearing vagina with normal color and discharge, no lesions              Cervix: no lesions              Pap taken: No. Bimanual  Exam:  Uterus:  normal size, contour, position, consistency, mobility, non-tender              Adnexa: no mass, fullness, tenderness              Anus:   Left perianal region with flat scar.   Chaperone was present for exam.  Assessment:   Well woman visit with normal exam. RA.  On MTX.  Sister with breast cancer 1 years old.  Nonhormonal.  Negative genetic testing.  Perirectal skin reaction.  Epidermal hyperplasia.  Improved status post cryotherapy.  Plan: Mammogram screening annually.  Self breast awareness reviewed. Pap and HR HPV as above. Guidelines for Calcium, Vitamin D, regular exercise program including cardiovascular and weight bearing exercise. STD screening.  Condoms recommended.  We discussed MTX may cause miscarriage. Initial information about IUDs. Follow up annually and prn.    After visit summary provided.

## 2019-11-09 ENCOUNTER — Ambulatory Visit (INDEPENDENT_AMBULATORY_CARE_PROVIDER_SITE_OTHER): Payer: BC Managed Care – PPO | Admitting: Obstetrics and Gynecology

## 2019-11-09 ENCOUNTER — Other Ambulatory Visit: Payer: Self-pay

## 2019-11-09 ENCOUNTER — Other Ambulatory Visit (HOSPITAL_COMMUNITY)
Admission: RE | Admit: 2019-11-09 | Payer: BC Managed Care – PPO | Source: Ambulatory Visit | Admitting: Obstetrics and Gynecology

## 2019-11-09 ENCOUNTER — Other Ambulatory Visit (HOSPITAL_COMMUNITY)
Admission: RE | Admit: 2019-11-09 | Discharge: 2019-11-09 | Disposition: A | Discharge status: Home / Self Care | Payer: BC Managed Care – PPO | Source: Ambulatory Visit | Attending: Obstetrics and Gynecology | Admitting: Obstetrics and Gynecology

## 2019-11-09 ENCOUNTER — Encounter: Payer: Self-pay | Admitting: Obstetrics and Gynecology

## 2019-11-09 VITALS — BP 118/72 | HR 70 | Resp 18 | Ht 65.0 in | Wt 153.6 lb

## 2019-11-09 DIAGNOSIS — Z01419 Encounter for gynecological examination (general) (routine) without abnormal findings: Secondary | ICD-10-CM | POA: Insufficient documentation

## 2019-11-09 DIAGNOSIS — Z113 Encounter for screening for infections with a predominantly sexual mode of transmission: Secondary | ICD-10-CM | POA: Insufficient documentation

## 2019-11-09 NOTE — Patient Instructions (Signed)
EXERCISE AND DIET:  We recommended that you start or continue a regular exercise program for good health. Regular exercise means any activity that makes your heart beat faster and makes you sweat.  We recommend exercising at least 30 minutes per day at least 3 days a week, preferably 4 or 5.  We also recommend a diet low in fat and sugar.  Inactivity, poor dietary choices and obesity can cause diabetes, heart attack, stroke, and kidney damage, among others.    ALCOHOL AND SMOKING:  Women should limit their alcohol intake to no more than 7 drinks/beers/glasses of wine (combined, not each!) per week. Moderation of alcohol intake to this level decreases your risk of breast cancer and liver damage. And of course, no recreational drugs are part of a healthy lifestyle.  And absolutely no smoking or even second hand smoke. Most people know smoking can cause heart and lung diseases, but did you know it also contributes to weakening of your bones? Aging of your skin?  Yellowing of your teeth and nails?  CALCIUM AND VITAMIN D:  Adequate intake of calcium and Vitamin D are recommended.  The recommendations for exact amounts of these supplements seem to change often, but generally speaking 600 mg of calcium (either carbonate or citrate) and 800 units of Vitamin D per day seems prudent. Certain women may benefit from higher intake of Vitamin D.  If you are among these women, your doctor will have told you during your visit.    PAP SMEARS:  Pap smears, to check for cervical cancer or precancers,  have traditionally been done yearly, although recent scientific advances have shown that most women can have pap smears less often.  However, every woman still should have a physical exam from her gynecologist every year. It will include a breast check, inspection of the vulva and vagina to check for abnormal growths or skin changes, a visual exam of the cervix, and then an exam to evaluate the size and shape of the uterus and  ovaries.  And after 38 years of age, a rectal exam is indicated to check for rectal cancers. We will also provide age appropriate advice regarding health maintenance, like when you should have certain vaccines, screening for sexually transmitted diseases, bone density testing, colonoscopy, mammograms, etc.   MAMMOGRAMS:  All women over 40 years old should have a yearly mammogram. Many facilities now offer a "3D" mammogram, which may cost around $50 extra out of pocket. If possible,  we recommend you accept the option to have the 3D mammogram performed.  It both reduces the number of women who will be called back for extra views which then turn out to be normal, and it is better than the routine mammogram at detecting truly abnormal areas.    COLONOSCOPY:  Colonoscopy to screen for colon cancer is recommended for all women at age 50.  We know, you hate the idea of the prep.  We agree, BUT, having colon cancer and not knowing it is worse!!  Colon cancer so often starts as a polyp that can be seen and removed at colonscopy, which can quite literally save your life!  And if your first colonoscopy is normal and you have no family history of colon cancer, most women don't have to have it again for 10 years.  Once every ten years, you can do something that may end up saving your life, right?  We will be happy to help you get it scheduled when you are ready.    Be sure to check your insurance coverage so you understand how much it will cost.  It may be covered as a preventative service at no cost, but you should check your particular policy.      Intrauterine Device Information An intrauterine device (IUD) is a medical device that is inserted in the uterus to prevent pregnancy. It is a small, T-shaped device that has one or two nylon strings hanging down from it. The strings hang out of the lower part of the uterus (cervix) to allow for future IUD removal. There are two types of IUDs available:  Hormone IUD. This  type of IUD is made of plastic and contains the hormone progestin (synthetic progesterone). A hormone IUD may last 3-5 years.  Copper IUD. This type of IUD has copper wire wrapped around it. A copper IUD may last up to 10 years. How is an IUD inserted? An IUD is inserted through the vagina and placed into the uterus with a minor medical procedure. The exact procedure for IUD insertion may vary among health care providers and hospitals. How does an IUD work? Synthetic progesterone in a hormonal IUD prevents pregnancy by:  Thickening cervical mucus to prevent sperm from entering the uterus.  Thinning the uterine lining to prevent a fertilized egg from being implanted there. Copper in a copper IUD prevents pregnancy by making the uterus and fallopian tubes produce a fluid that kills sperm. What are the advantages of an IUD? Advantages of either type of IUD  It is highly effective in preventing pregnancy.  It is reversible. You can become pregnant shortly after the IUD is removed.  It is low-maintenance and can stay in place for a long time.  There are no estrogen-related side effects.  It can be used when breastfeeding.  It is not associated with weight gain.  It can be inserted right after childbirth, an abortion, or a miscarriage. Advantages of a hormone IUD  If it is inserted within 7 days of your period starting, it works right after it is inserted. If the hormone IUD is inserted at any other time in your cycle, you will need to use a backup method of birth control for 7 days after insertion.  It can make menstrual periods lighter.  It can reduce menstrual cramping.  It can be used for 3-5 years. Advantages of a copper IUD  It works right after it is inserted.  It can be used as a form of emergency birth control if it is inserted within 5 days after having unprotected sex.  It does not interfere with your body's natural hormones.  It can be used for 10 years. What are  the disadvantages of an IUD?  An IUD may cause irregular menstrual bleeding for a period of time after insertion.  You may have pain during insertion and have cramping and vaginal bleeding after insertion.  An IUD may cut the uterus (uterine perforation) when it is inserted. This is rare.  An IUD may cause pelvic inflammatory disease (PID), which is an infection in the uterus and fallopian tubes. This is rare, and it usually happens during the first 20 days after the IUD is inserted.  A copper IUD can make your menstrual flow heavier and more painful. How is an IUD removed?  You will lie on your back with your knees bent and your feet in footrests (stirrups).  A device will be inserted into your vagina to spread apart the vaginal walls (speculum). This will allow your  health care provider to see the strings attached to the IUD.  Your health care provider will use a small instrument (forceps) to grasp the IUD strings and pull firmly until the IUD is removed. You may have some discomfort when the IUD is removed. Your health care provider may recommend taking over-the-counter pain relievers, such as ibuprofen, before the procedure. You may also have minor spotting for a few days after the procedure. The exact procedure for IUD removal may vary among health care providers and hospitals. Is the IUD right for me? Your health care provider will make sure you are a good candidate for an IUD and will discuss the advantages, disadvantages, and possible side effects with you. Summary  An intrauterine device (IUD) is a medical device that is inserted in the uterus to prevent pregnancy. It is a small, T-shaped device that has one or two nylon strings hanging down from it.  A hormone IUD contains the hormone progestin (synthetic progesterone). A copper IUD has copper wire wrapped around it.  Synthetic progesterone in a hormone IUD prevents pregnancy by thickening cervical mucus and thinning the walls  of the uterus. Copper in a copper IUD prevents pregnancy by making the uterus and fallopian tubes produce a fluid that kills sperm.  A hormone IUD can be left in place for 3-5 years. A copper IUD can be left in place for up to 10 years.  An IUD is inserted and removed by a health care provider. You may feel some pain during insertion and removal. Your health care provider may recommend taking over-the-counter pain medicine, such as ibuprofen, before an IUD procedure. This information is not intended to replace advice given to you by your health care provider. Make sure you discuss any questions you have with your health care provider. Document Revised: 03/29/2017 Document Reviewed: 05/15/2016 Elsevier Patient Education  2020 [employer].

## 2019-11-10 LAB — LIPID PANEL
Chol/HDL Ratio: 3.4 ratio (ref 0.0–4.4)
Cholesterol, Total: 210 mg/dL — ABNORMAL HIGH (ref 100–199)
HDL: 62 mg/dL (ref >39)
LDL Chol Calc (NIH): 129 mg/dL — ABNORMAL HIGH (ref 0–99)
Triglycerides: 107 mg/dL (ref 0–149)
VLDL Cholesterol Cal: 19 mg/dL (ref 5–40)

## 2019-11-10 LAB — CERVICOVAGINAL ANCILLARY ONLY
Chlamydia: NEGATIVE
Comment: NEGATIVE
Comment: NEGATIVE
Comment: NORMAL
Neisseria Gonorrhea: NEGATIVE
Trichomonas: NEGATIVE

## 2019-11-10 LAB — HEP, RPR, HIV PANEL
HIV Screen 4th Generation wRfx: NONREACTIVE
Hepatitis B Surface Ag: NEGATIVE
RPR Ser Ql: NONREACTIVE

## 2019-11-10 LAB — HEPATITIS C ANTIBODY: Hep C Virus Ab: <0.1 s/co ratio (ref 0.0–0.9)

## 2019-11-25 ENCOUNTER — Other Ambulatory Visit: Payer: Self-pay | Admitting: Obstetrics and Gynecology

## 2019-11-25 DIAGNOSIS — Z1231 Encounter for screening mammogram for malignant neoplasm of breast: Secondary | ICD-10-CM

## 2020-01-29 ENCOUNTER — Ambulatory Visit: Payer: 59

## 2020-01-29 ENCOUNTER — Ambulatory Visit
Admission: RE | Admit: 2020-01-29 | Discharge: 2020-01-29 | Disposition: A | Discharge status: Home / Self Care | Payer: 59 | Source: Ambulatory Visit | Attending: Obstetrics and Gynecology | Admitting: Obstetrics and Gynecology

## 2020-01-29 ENCOUNTER — Other Ambulatory Visit: Payer: Self-pay

## 2020-01-29 DIAGNOSIS — Z1231 Encounter for screening mammogram for malignant neoplasm of breast: Secondary | ICD-10-CM

## 2020-02-01 ENCOUNTER — Ambulatory Visit: Payer: 59

## 2020-02-16 ENCOUNTER — Ambulatory Visit: Payer: 59

## 2020-03-08 ENCOUNTER — Ambulatory Visit: Payer: BC Managed Care – PPO

## 2020-03-15 ENCOUNTER — Other Ambulatory Visit: Payer: Self-pay

## 2020-03-15 ENCOUNTER — Ambulatory Visit
Admission: RE | Admit: 2020-03-15 | Discharge: 2020-03-15 | Disposition: A | Discharge status: Home / Self Care | Payer: BC Managed Care – PPO | Source: Ambulatory Visit | Attending: Obstetrics and Gynecology | Admitting: Obstetrics and Gynecology

## 2020-05-23 ENCOUNTER — Ambulatory Visit: Payer: 59 | Admitting: Obstetrics and Gynecology

## 2020-11-03 NOTE — Progress Notes (Signed)
40 y.o. G0P0000 Single Caucasian female here for annual exam.    Has menstrual HAs. Uses Tylenol to treat if needed.  Manageable.   Going to the beach with boyfriend end of the month.  No change in partner.   Labs with PCP.   Did Covid vaccine but not booster.   PCP:   Dr. Octaviano Glow.  Patient's last menstrual period was 10/31/2020.     Period Cycle (Days): 28 Period Duration (Days): 4 Period Pattern: Regular Menstrual Flow: Moderate Dysmenorrhea: None     Sexually active: Yes.    The current method of family planning is condoms  Exercising: Yes.   Gym.  Smoker:  no  Health Maintenance: Pap:  11-25-18 normal neg HPV History of abnormal Pap:  no MMG:  03-15-20 normal Colonoscopy:  N/A BMD:   N/A Result  N/A TDaP:  2013 Gardasil:   yes HIV:Neg Hep C:Neg Screening Labs:  Hb today: PCP, Urine today: PCP   reports that she has never smoked. She has never used smokeless tobacco. She reports current alcohol use. She reports that she does not use drugs.  Past Medical History:  Diagnosis Date   Rheumatoid arthritis (HCC)     History reviewed. No pertinent surgical history.  Current Outpatient Medications  Medication Sig Dispense Refill   Ascorbic Acid (VITAMIN C) 1000 MG tablet Take 1,000 mg by mouth daily.     Biotin 17408 MCG TABS Take 1 tablet by mouth daily.     Cholecalciferol (VITAMIN D3) 125 MCG (5000 UT) CAPS Take 1 capsule by mouth daily.     ferrous sulfate 325 (65 FE) MG EC tablet Take 325 mg by mouth daily.     folic acid (FOLVITE) 1 MG tablet Take 1 mg by mouth daily.     methotrexate (RHEUMATREX) 2.5 MG tablet Take 2.5 mg by mouth. Caution:Chemotherapy. Protect from light.PATIENT TAKES WED. AND THURS.     tretinoin (RETIN-A) 0.05 % cream APPLY PEA SIZE AMOUNT TO FACE AT BEDTIME     VITAMIN A PO Take 1 tablet by mouth daily.     No current facility-administered medications for this visit.    Family History  Problem Relation Age of Onset   Hypertension  Father    Hyperlipidemia Father    Diabetes Father    Heart Problems Father    Breast cancer Sister 40    Review of Systems  All other systems reviewed and are negative.  Exam:   BP 116/70   Pulse 70   Ht 5' 4.5" (1.638 m)   Wt 153 lb (69.4 kg)   LMP 10/31/2020   SpO2 99%   BMI 25.86 kg/m     General appearance: alert, cooperative and appears stated age Head: normocephalic, without obvious abnormality, atraumatic Neck: no adenopathy, supple, symmetrical, trachea midline and thyroid normal to inspection and palpation Lungs: clear to auscultation bilaterally Breasts: normal appearance, no masses or tenderness, No nipple retraction or dimpling, No nipple discharge or bleeding, No axillary adenopathy Heart: regular rate and rhythm Abdomen: soft, non-tender; no masses, no organomegaly Extremities: extremities normal, atraumatic, no cyanosis or edema Skin: skin color, texture, turgor normal. No rashes or lesions Lymph nodes: cervical, supraclavicular, and axillary nodes normal. Neurologic: grossly normal  Pelvic: External genitalia:  no lesions              No abnormal inguinal nodes palpated.              Urethra:  normal appearing urethra with no masses,  tenderness or lesions              Bartholins and Skenes: normal                 Vagina: normal appearing vagina with normal color and discharge, no lesions              Cervix: no lesions              Pap taken: no Bimanual Exam:  Uterus:  normal size, contour, position, consistency, mobility, non-tender              Adnexa: no mass, fullness, tenderness    Chaperone was present for exam.  Assessment:   Well woman visit with normal exam. RA.  On MTX. Sister with breast cancer 20 years old.  Nonhormonal.  Negative genetic testing. Perirectal skin reaction.  Epidermal hyperplasia.  Improved status post cryotherapy.  Plan: Annual mammogram screening discussed. Self breast awareness reviewed. Pap and HR HPV  2025. Guidelines for Calcium, Vitamin D, regular exercise program including cardiovascular and weight bearing exercise. Labs with PCP. I discussed Covid booster. Follow up annually and prn.     After visit summary provided.

## 2020-11-10 ENCOUNTER — Other Ambulatory Visit: Payer: Self-pay

## 2020-11-10 ENCOUNTER — Ambulatory Visit (INDEPENDENT_AMBULATORY_CARE_PROVIDER_SITE_OTHER): Payer: BC Managed Care – PPO | Admitting: Obstetrics and Gynecology

## 2020-11-10 ENCOUNTER — Other Ambulatory Visit: Payer: Self-pay | Admitting: Obstetrics and Gynecology

## 2020-11-10 ENCOUNTER — Encounter: Payer: Self-pay | Admitting: Obstetrics and Gynecology

## 2020-11-10 VITALS — BP 116/70 | HR 70 | Ht 64.5 in | Wt 153.0 lb

## 2020-11-10 DIAGNOSIS — Z1231 Encounter for screening mammogram for malignant neoplasm of breast: Secondary | ICD-10-CM

## 2020-11-10 DIAGNOSIS — Z01419 Encounter for gynecological examination (general) (routine) without abnormal findings: Secondary | ICD-10-CM

## 2020-11-10 NOTE — Patient Instructions (Signed)

## 2021-01-31 ENCOUNTER — Ambulatory Visit: Payer: BC Managed Care – PPO

## 2021-02-07 ENCOUNTER — Ambulatory Visit
Admission: RE | Admit: 2021-02-07 | Discharge: 2021-02-07 | Disposition: A | Discharge status: Home / Self Care | Payer: BC Managed Care – PPO | Source: Ambulatory Visit | Attending: Obstetrics and Gynecology | Admitting: Obstetrics and Gynecology

## 2021-02-07 DIAGNOSIS — Z1231 Encounter for screening mammogram for malignant neoplasm of breast: Secondary | ICD-10-CM

## 2021-03-02 IMAGING — MG MM DIGITAL SCREENING BILAT W/ TOMO W/ CAD
8 of 14 series · 8 of 40 positions shown · non-contrast
Comparison: None.

CLINICAL DATA: Screening. Patient's sister was diagnosed with
breast cancer at age 38.

EXAM:
DIGITAL SCREENING BILATERAL MAMMOGRAM WITH TOMO AND CAD

[L CC synth-2D (1 of 2)]
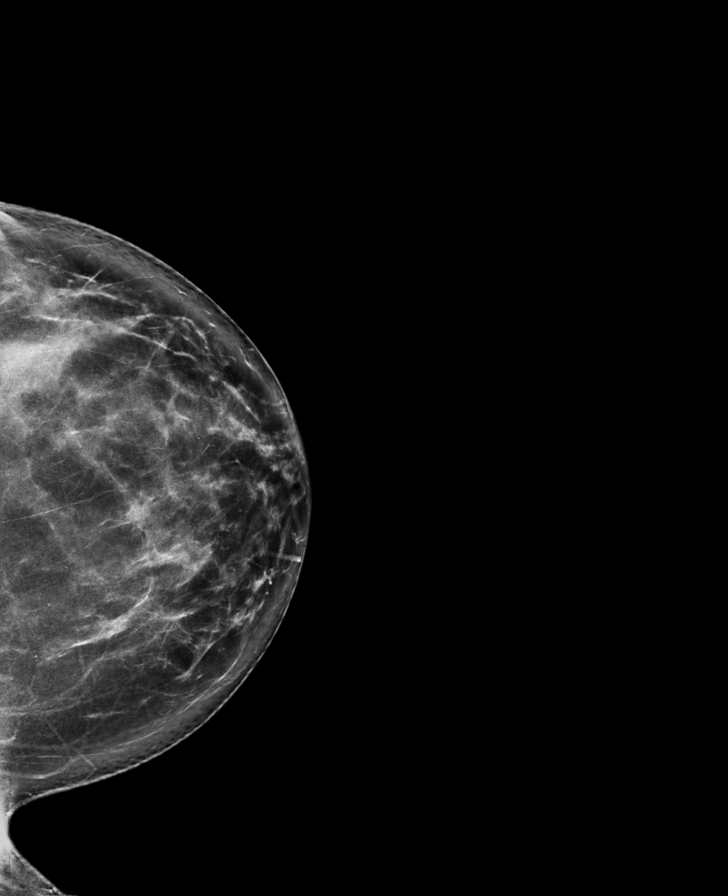

[L MLO synth-2D]
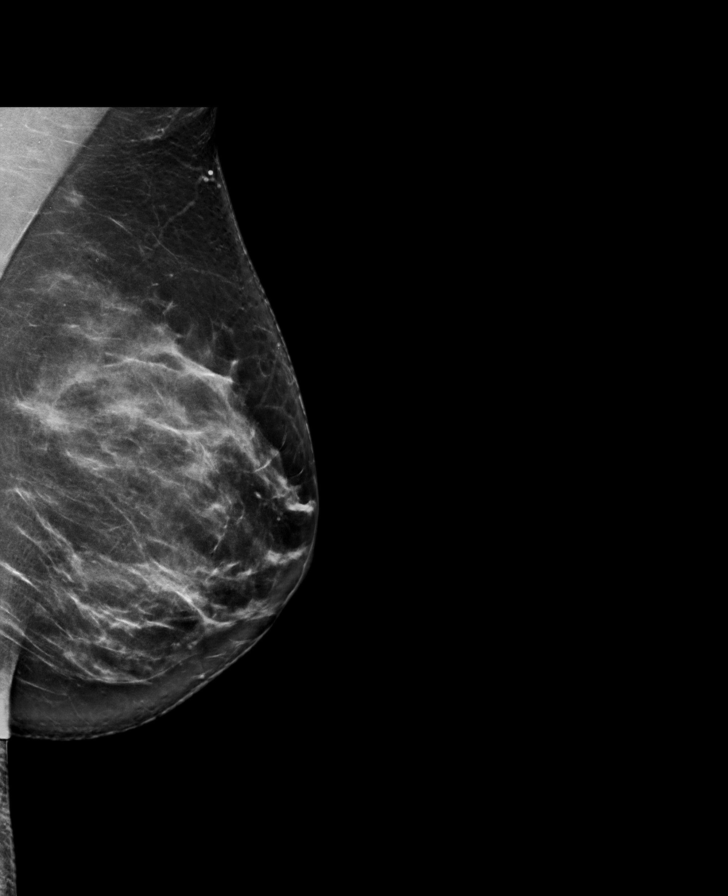

[R CC synth-2D (1 of 2)]
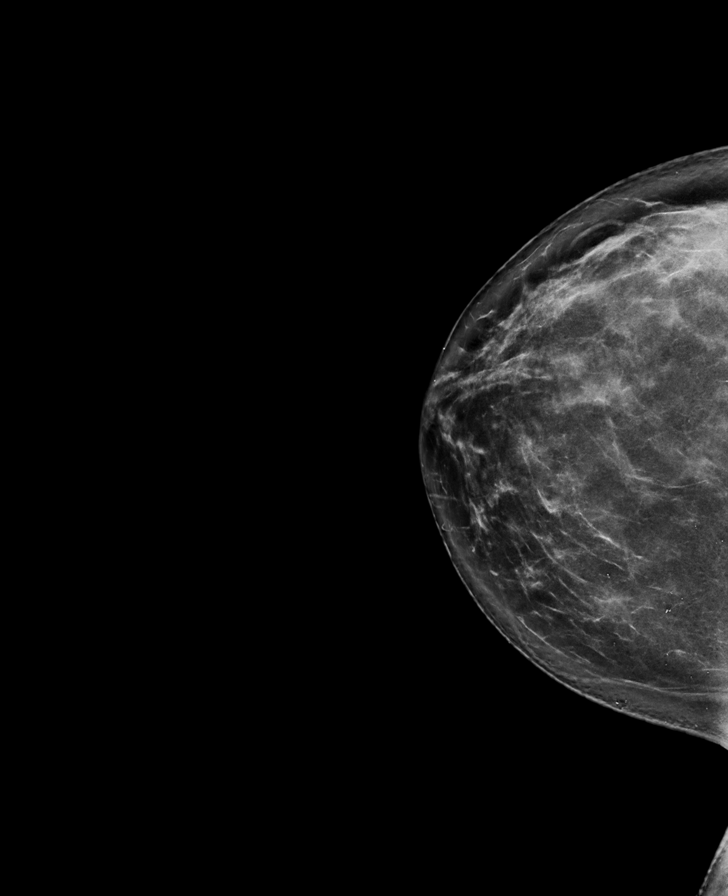

[L CC synth-2D (2 of 2)]
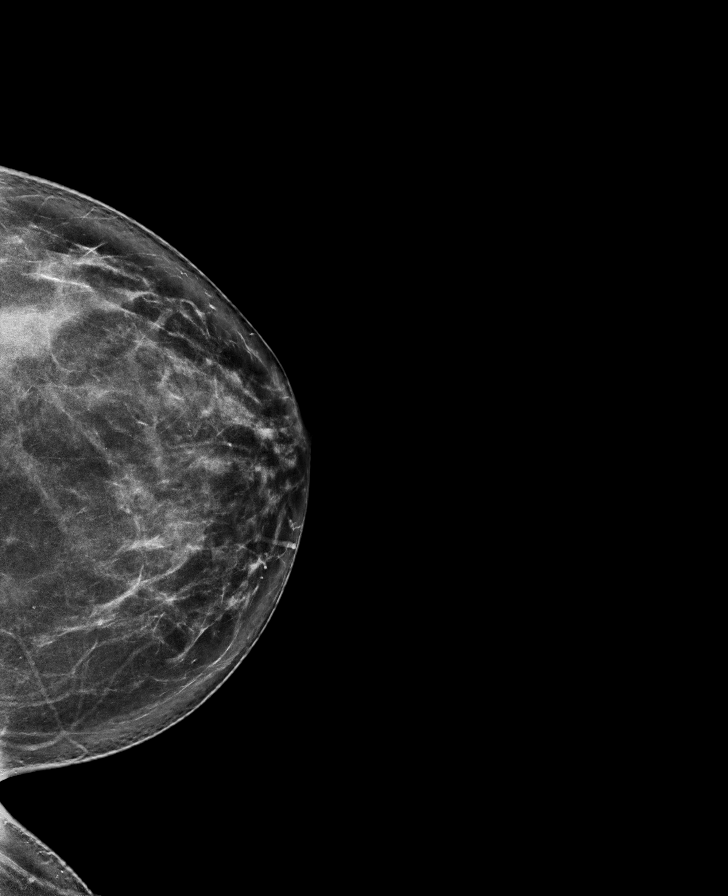

[R MLO synth-2D]
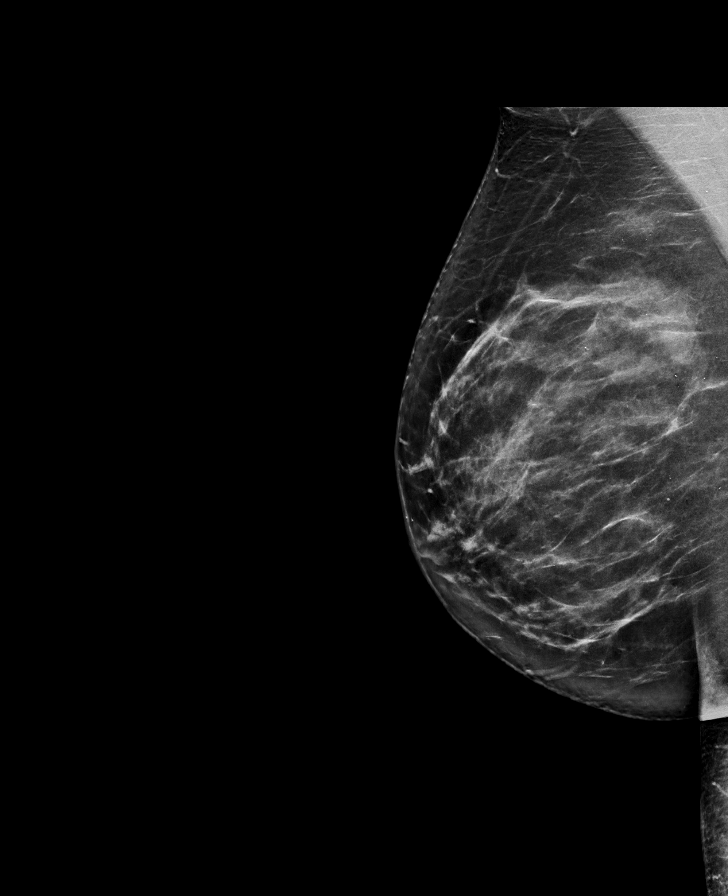

[R CC synth-2D (2 of 2)]
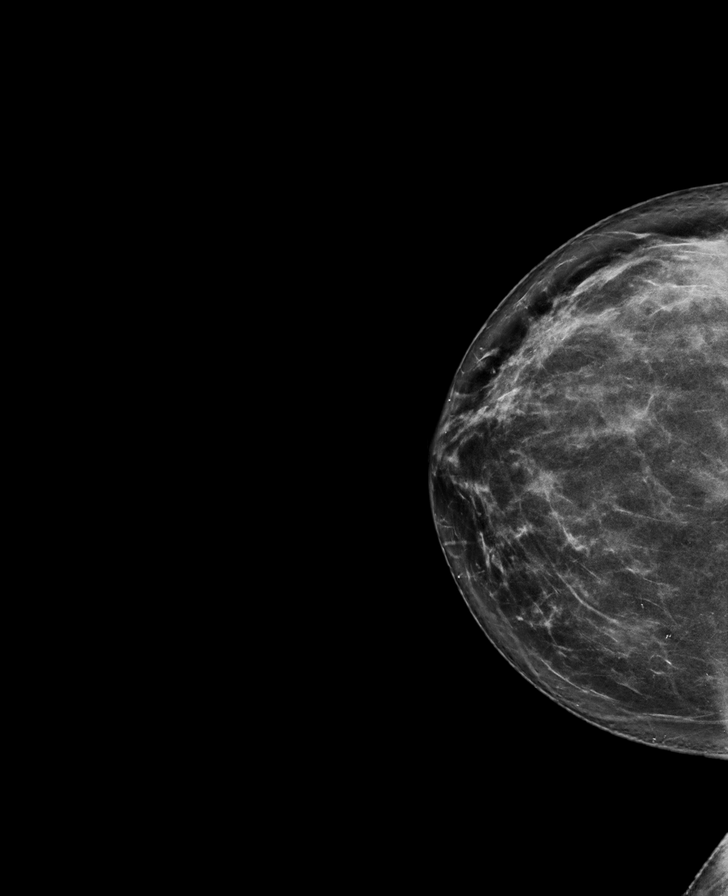

[L XCCL synth-2D]
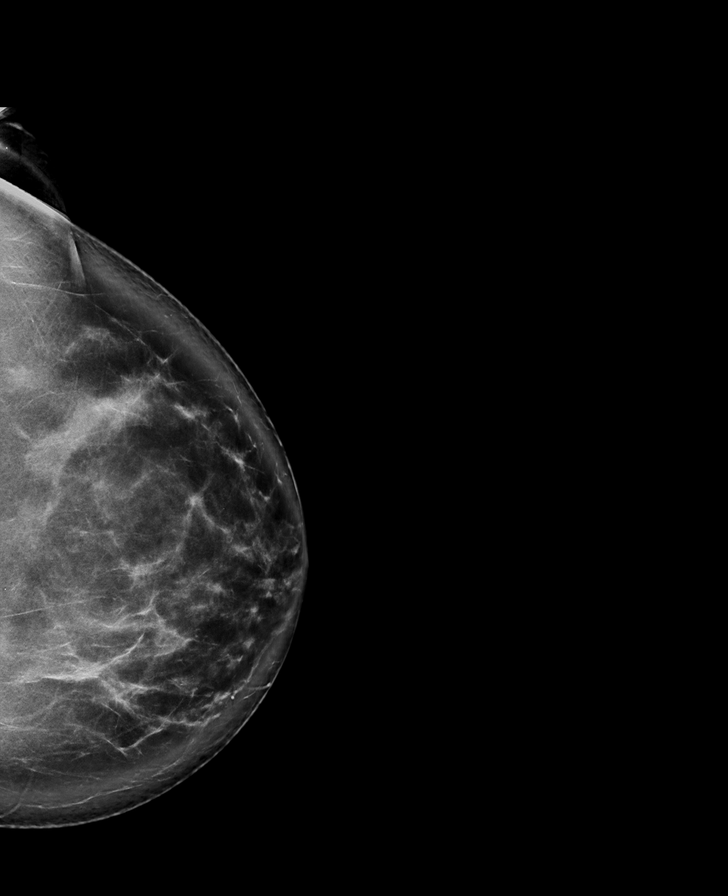

[L CC tomo · tomo slice 45/89.0]
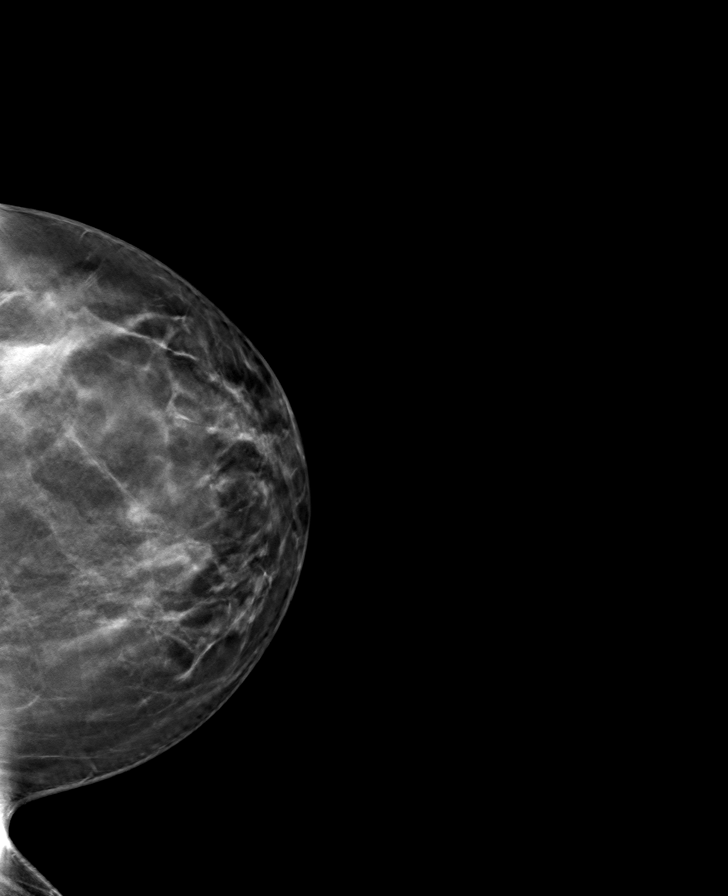

[8 of 40 positions shown; findings below may reference images not displayed]

ACR Breast Density Category c: The breast tissue is heterogeneously
dense, which may obscure small masses
FINDINGS: There are no findings suspicious for malignancy. Images were
processed with CAD.
IMPRESSION: No mammographic evidence of malignancy. A result letter of this
screening mammogram will be mailed directly to the patient.

RECOMMENDATION:
(Screening mammogram in one year.(Code:[id number]-[id number])

BI-RADS CATEGORY  1: Negative.

## 2021-03-17 ENCOUNTER — Ambulatory Visit
Admission: RE | Admit: 2021-03-17 | Discharge: 2021-03-17 | Disposition: A | Discharge status: Home / Self Care | Payer: BC Managed Care – PPO | Source: Ambulatory Visit | Attending: Obstetrics and Gynecology | Admitting: Obstetrics and Gynecology

## 2021-03-17 ENCOUNTER — Other Ambulatory Visit: Payer: Self-pay

## 2021-03-17 DIAGNOSIS — Z1231 Encounter for screening mammogram for malignant neoplasm of breast: Secondary | ICD-10-CM

## 2021-11-13 ENCOUNTER — Other Ambulatory Visit (HOSPITAL_COMMUNITY)
Admission: RE | Admit: 2021-11-13 | Discharge: 2021-11-13 | Disposition: A | Discharge status: Home / Self Care | Payer: 59 | Source: Ambulatory Visit | Attending: Obstetrics and Gynecology | Admitting: Obstetrics and Gynecology

## 2021-11-13 ENCOUNTER — Ambulatory Visit (INDEPENDENT_AMBULATORY_CARE_PROVIDER_SITE_OTHER): Payer: 59 | Admitting: Obstetrics and Gynecology

## 2021-11-13 ENCOUNTER — Encounter: Payer: Self-pay | Admitting: Obstetrics and Gynecology

## 2021-11-13 VITALS — BP 94/72 | HR 64 | Resp 16 | Ht 64.25 in | Wt 157.0 lb

## 2021-11-13 DIAGNOSIS — Z124 Encounter for screening for malignant neoplasm of cervix: Secondary | ICD-10-CM | POA: Insufficient documentation

## 2021-11-13 DIAGNOSIS — Z01419 Encounter for gynecological examination (general) (routine) without abnormal findings: Secondary | ICD-10-CM

## 2021-11-13 DIAGNOSIS — B977 Papillomavirus as the cause of diseases classified elsewhere: Secondary | ICD-10-CM

## 2021-11-13 HISTORY — DX: Papillomavirus as the cause of diseases classified elsewhere: B97.7

## 2021-11-13 NOTE — Progress Notes (Unsigned)
41 y.o. G0P0000 Single Caucasian female here for annual exam.   Moving to the [place] this month.   Declines STD testing today.   PCP: Donia Pounds, MD  LMP: 11/07/2021     Period Cycle (Days):  (28-30) Period Duration (Days): 4 Menstrual Flow: Moderate Menstrual Control: Maxi pad Dysmenorrhea: None (headaches/mood changes)     Sexually active: Yes.    The current method of family planning is condoms .    Exercising: Not in the last 3 weeks.  Usually walks 3-4x a week and plays tennis  Smoker:  no  Health Maintenance: Pap:  11-25-18 Neg:Neg HR HPV History of abnormal Pap:  no MMG:  03-17-21 Neg/BiRads1.  She will do in [place] at the end of the year. Colonoscopy:  n/a BMD:   n/a  Result  n/a TDaP: 2013 Gardasil: yes HIV: Neg in past Hep C: Neg in past Screening Labs:  Functional medicine doctor.    reports that she has never smoked. She has never used smokeless tobacco. She reports current alcohol use. She reports that she does not use drugs.  Past Medical History:  Diagnosis Date   Rheumatoid arthritis (HCC)     History reviewed. No pertinent surgical history.  Current Outpatient Medications  Medication Sig Dispense Refill   Ascorbic Acid (VITAMIN C) 1000 MG tablet Take 1,000 mg by mouth daily.     Biotin 29937 MCG TABS Take 1 tablet by mouth daily.     Cholecalciferol (VITAMIN D3) 125 MCG (5000 UT) CAPS Take 1 capsule by mouth daily.     ferrous sulfate 325 (65 FE) MG EC tablet Take 325 mg by mouth daily.     folic acid (FOLVITE) 1 MG tablet Take 1 mg by mouth daily.     methotrexate (RHEUMATREX) 2.5 MG tablet Take 2.5 mg by mouth. Caution:Chemotherapy. Protect from light.PATIENT TAKES WED. AND THURS.     VITAMIN A PO Take 1 tablet by mouth daily.     tretinoin (RETIN-A) 0.05 % cream APPLY PEA SIZE AMOUNT TO FACE AT BEDTIME (Patient not taking: Reported on 11/13/2021)     No current facility-administered medications for this visit.    Family History   Problem Relation Age of Onset   Hypertension Father    Hyperlipidemia Father    Diabetes Father    Heart Problems Father    Breast cancer Sister 101    Review of Systems  All other systems reviewed and are negative.   Exam:   BP 94/72   Pulse 64   Resp 16   Ht 5' 4.25" (1.632 m)   Wt 157 lb (71.2 kg)   LMP 11/07/2021 (Exact Date)   BMI 26.74 kg/m     General appearance: alert, cooperative and appears stated age Head: normocephalic, without obvious abnormality, atraumatic Neck: no adenopathy, supple, symmetrical, trachea midline and thyroid normal to inspection and palpation Lungs: clear to auscultation bilaterally Breasts: normal appearance, no masses or tenderness, No nipple retraction or dimpling, No nipple discharge or bleeding, No axillary adenopathy Heart: regular rate and rhythm Abdomen: soft, non-tender; no masses, no organomegaly Extremities: extremities normal, atraumatic, no cyanosis or edema Skin: skin color, texture, turgor normal. No rashes or lesions Lymph nodes: cervical, supraclavicular, and axillary nodes normal. Neurologic: grossly normal  Pelvic: External genitalia:  no lesions              No abnormal inguinal nodes palpated.              Urethra:  normal appearing urethra with no masses, tenderness or lesions              Bartholins and Skenes: normal                 Vagina: normal appearing vagina with normal color and discharge, no lesions              Cervix: no lesions              Pap taken: {yes no:314532} Bimanual Exam:  Uterus:  normal size, contour, position, consistency, mobility, non-tender              Adnexa: no mass, fullness, tenderness              Rectal exam: {yes no:314532}.  Confirms.              Anus:  normal sphincter tone, no lesions  Chaperone was present for exam:  taylor  Assessment:   Well woman visit with gynecologic exam. RA.  On MTX. Sister with breast cancer 32 years old.  Nonhormonal.  Negative genetic  testing. Perirectal skin reaction.  Epidermal hyperplasia.  Improved status post cryotherapy. 2 ockock 4 mm area, cyst.  Pap taken  Plan: Mammogram screening discussed. Self breast awareness reviewed. Pap and HR HPV as above. Guidelines for Calcium, Vitamin D, regular exercise program including cardiovascular and weight bearing exercise.   Follow up annually and prn.   Additional counseling given.  {yes [id number]. _______ minutes face to face time of which over 50% was spent in counseling.    After visit summary provided.

## 2021-11-13 NOTE — Patient Instructions (Signed)

## 2021-11-17 ENCOUNTER — Other Ambulatory Visit: Payer: Self-pay | Admitting: *Deleted

## 2021-11-17 ENCOUNTER — Encounter: Payer: Self-pay | Admitting: Obstetrics and Gynecology

## 2021-11-17 DIAGNOSIS — R87810 Cervical high risk human papillomavirus (HPV) DNA test positive: Secondary | ICD-10-CM

## 2021-11-17 LAB — CYTOLOGY - PAP
Comment: NEGATIVE
Diagnosis: NEGATIVE
High risk HPV: POSITIVE — AB

## 2021-11-20 ENCOUNTER — Telehealth: Payer: Self-pay

## 2021-11-20 NOTE — Progress Notes (Unsigned)
  Subjective:     Patient ID: Vicki Tyler, female   DOB: 07-Mar-1981, 41 y.o.   MRN: [id number]  HPI Patient here today for colposcopy with pap 11-13-21 Neg:Pos HR HPV.   Review of Systems LMP: Contraception: condoms UPT:     Objective:   Physical Exam     Assessment:     ***    Plan:     ***

## 2021-11-20 NOTE — Telephone Encounter (Signed)
Patient is coming Weds 11/22/21 for Colposcopy.  She is to leave right after that procedure to move out of country for work for a year. She had questions that really can only be answered once pathology returns from biopsy.  She was asking about what treatment will be required, etc. I explained to her that until Dr. Edward Jolly does colpo and received biopsy result no way to predict.  She was understanding and said she will talk with Dr. Edward Jolly on Weds about it.

## 2021-11-22 ENCOUNTER — Ambulatory Visit (INDEPENDENT_AMBULATORY_CARE_PROVIDER_SITE_OTHER): Payer: 59 | Admitting: Obstetrics and Gynecology

## 2021-11-22 ENCOUNTER — Encounter: Payer: Self-pay | Admitting: Obstetrics and Gynecology

## 2021-11-22 ENCOUNTER — Other Ambulatory Visit (HOSPITAL_COMMUNITY)
Admission: RE | Admit: 2021-11-22 | Discharge: 2021-11-22 | Disposition: A | Discharge status: Home / Self Care | Payer: 59 | Source: Ambulatory Visit | Attending: Obstetrics and Gynecology | Admitting: Obstetrics and Gynecology

## 2021-11-22 ENCOUNTER — Ambulatory Visit: Payer: 59

## 2021-11-22 VITALS — BP 130/70 | Ht 64.25 in | Wt 157.0 lb

## 2021-11-22 DIAGNOSIS — Z01812 Encounter for preprocedural laboratory examination: Secondary | ICD-10-CM

## 2021-11-22 DIAGNOSIS — R87810 Cervical high risk human papillomavirus (HPV) DNA test positive: Secondary | ICD-10-CM | POA: Insufficient documentation

## 2021-11-22 LAB — PREGNANCY, URINE: Preg Test, Ur: NEGATIVE

## 2021-11-22 NOTE — Patient Instructions (Signed)
Colposcopy, Care After  The following information offers guidance on how to care for yourself after your procedure. Your health care provider may also give you more specific instructions. If you have problems or questions, contact your health care provider. What can I expect after the procedure? If you had a colposcopy without a biopsy, you can expect to feel fine right away after your procedure. However, you may have some spotting of blood for a few days. You can return to your normal activities. If you had a colposcopy with a biopsy, it is common after the procedure to have: Soreness and mild pain. These may last for a few days. Mild vaginal bleeding or discharge that is dark-colored and grainy. This may last for a few days. The discharge may be caused by a liquid (solution) that was used during the procedure. You may need to wear a sanitary pad during this time. Spotting of blood for at least 48 hours after the procedure. Follow these instructions at home: Medicines Take over-the-counter and prescription medicines only as told by your health care provider. Talk with your health care provider about what type of over-the-counter pain medicines and prescription medicines you can start to take again. It is especially important to talk with your health care provider if you take blood thinners. Activity Avoid using douche products, using tampons, and having sex for at least 3 days after the procedure or for as long as told by your health care provider. Return to your normal activities as told by your health care provider. Ask your health care provider what activities are safe for you. General instructions Ask your health care provider if you may take baths, swim, or use a hot tub. You may take showers. If you use birth control (contraception), continue to use it. Keep all follow-up visits. This is important. Contact a health care provider if: You have a fever or chills. You faint or feel  light-headed. Get help right away if: You have heavy bleeding from your vagina or pass blood clots. Heavy bleeding is bleeding that soaks through a sanitary pad in less than 1 hour. You have vaginal discharge that is abnormal, is yellow in color, or smells bad. This could be a sign of infection. You have severe pain or cramps in your lower abdomen that do not go away with medicine. Summary If you had a colposcopy without a biopsy, you can expect to feel fine right away, but you may have some spotting of blood for a few days. You can return to your normal activities. If you had a colposcopy with a biopsy, it is common to have mild pain for a few days and spotting for 48 hours after the procedure. Avoid using douche products, using tampons, and having sex for at least 3 days after the procedure or for as long as told by your health care provider. Get help right away if you have heavy bleeding, severe pain, or signs of infection. This information is not intended to replace advice given to you by your health care provider. Make sure you discuss any questions you have with your health care provider. Document Revised: 09/11/2020 Document Reviewed: 09/11/2020 Elsevier Patient Education  2023 [employer]  

## 2021-11-24 LAB — SURGICAL PATHOLOGY

## 2022-08-14 ENCOUNTER — Encounter: Payer: Self-pay | Admitting: Obstetrics and Gynecology

## 2023-02-04 NOTE — Progress Notes (Deleted)
42 y.o. G0P0000 Single [place] female here for annual exam.    PCP:     No LMP recorded.           Sexually active: {yes no:314532}  The current method of family planning is {contraception:315051}.    Exercising: {yes no:314532}  {types:19826} Smoker:  no  Health Maintenance: Pap:  11/13/21 neg: HR HPV positive History of abnormal Pap:  no MMG:  03/17/21 Breast Density Cat C, BI-RADS CAT 1 neg Colonoscopy:  n/a BMD:   n/a  Result  n/a TDaP:  2013 Gardasil:   yes HIV: 11/09/19 NR Hep C: 11/09/19 neg Screening Labs:  Hb today: ***, Urine today: ***   reports that she has never smoked. She has never used smokeless tobacco. She reports current alcohol use. She reports that she does not use drugs.  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  Current Outpatient Medications  Medication Sig Dispense Refill   Ascorbic Acid (VITAMIN C) 1000 MG tablet Take 1,000 mg by mouth daily.     Biotin 32440 MCG TABS Take 1 tablet by mouth daily.     Cholecalciferol (VITAMIN D3) 125 MCG (5000 UT) CAPS Take 1 capsule by mouth daily.     ferrous sulfate 325 (65 FE) MG EC tablet Take 325 mg by mouth daily.     folic acid (FOLVITE) 1 MG tablet Take 1 mg by mouth daily.     methotrexate (RHEUMATREX) 2.5 MG tablet Take 2.5 mg by mouth. Caution:Chemotherapy. Protect from light.PATIENT TAKES WED. AND THURS.     VITAMIN A PO Take 1 tablet by mouth daily.     No current facility-administered medications for this visit.    Family History  Problem Relation Age of Onset   Hypertension Father    Hyperlipidemia Father    Diabetes Father    Heart Problems Father    Breast cancer Sister 35    Review of Systems  Exam:   There were no vitals taken for this visit.    General appearance: alert, cooperative and appears stated age Head: normocephalic, without obvious  abnormality, atraumatic Neck: no adenopathy, supple, symmetrical, trachea midline and thyroid normal to inspection and palpation Lungs: clear to auscultation bilaterally Breasts: normal appearance, no masses or tenderness, No nipple retraction or dimpling, No nipple discharge or bleeding, No axillary adenopathy Heart: regular rate and rhythm Abdomen: soft, non-tender; no masses, no organomegaly Extremities: extremities normal, atraumatic, no cyanosis or edema Skin: skin color, texture, turgor normal. No rashes or lesions Lymph nodes: cervical, supraclavicular, and axillary nodes normal. Neurologic: grossly normal  Pelvic: External genitalia:  no lesions              No abnormal inguinal nodes palpated.              Urethra:  normal appearing urethra with no masses, tenderness or lesions              Bartholins and Skenes: normal                 Vagina: normal appearing vagina with normal color and discharge, no lesions              Cervix: no lesions              Pap taken: {yes no:314532} Bimanual Exam:  Uterus:  normal size, contour, position, consistency, mobility, non-tender              Adnexa: no mass, fullness, tenderness              Rectal exam: {yes no:314532}.  Confirms.              Anus:  normal sphincter tone, no lesions  Chaperone was present for exam:  ***  Assessment:   Well woman visit with gynecologic exam.   Plan: Mammogram screening discussed. Self breast awareness reviewed. Pap and HR HPV as above. Guidelines for Calcium, Vitamin D, regular exercise program including cardiovascular and weight bearing exercise.   Follow up annually and prn.   Additional counseling given.  {yes [id number]. _______ minutes face to face time of which over 50% was spent in counseling.    After visit summary provided.

## 2023-02-18 ENCOUNTER — Ambulatory Visit: Payer: 59 | Admitting: Obstetrics and Gynecology
